# Patient Record
Sex: Female | Born: 1990 | Race: Black or African American | Hispanic: No | Marital: Single | Smoking: Never smoker
Health system: Southern US, Community
[De-identification: ages and names within clinical notes are randomized; demographics above are authoritative.]

---

## 2013-06-05 ENCOUNTER — Encounter (HOSPITAL_COMMUNITY): Payer: Self-pay | Admitting: Emergency Medicine

## 2013-06-05 ENCOUNTER — Emergency Department (HOSPITAL_COMMUNITY)
Admission: EM | Admit: 2013-06-05 | Discharge: 2013-06-05 | Disposition: A | Discharge status: Home / Self Care | Payer: BC Managed Care – PPO | Attending: Emergency Medicine | Admitting: Emergency Medicine

## 2013-06-05 ENCOUNTER — Emergency Department (HOSPITAL_COMMUNITY): Payer: BC Managed Care – PPO

## 2013-06-05 DIAGNOSIS — R0789 Other chest pain: Secondary | ICD-10-CM

## 2013-06-05 DIAGNOSIS — R071 Chest pain on breathing: Secondary | ICD-10-CM | POA: Insufficient documentation

## 2013-06-05 MED ORDER — IBUPROFEN 800 MG PO TABS: 800.0000 mg | ORAL_TABLET | Freq: Three times a day (TID) | ORAL | Status: AC

## 2013-06-05 MED ORDER — IBUPROFEN 800 MG PO TABS
800.0000 mg | ORAL_TABLET | Freq: Once | ORAL | Status: AC
  Administered 2013-06-05: 800 mg via ORAL
  Filled 2013-06-05: qty 1

## 2013-06-05 NOTE — Discharge Instructions (Signed)

## 2013-06-05 NOTE — ED Provider Notes (Signed)
CSN: [id number]     Arrival date & time 06/05/13  0131 History   First MD Initiated Contact with Patient 06/05/13 0354     No chief complaint on file.    (Consider location/radiation/quality/duration/timing/severity/associated sxs/prior Treatment) HPI  History provided by patient. Workup tonight around midnight with sharp left-sided chest pain. She is able to point with one finger where hurts. Severe pain lasts about an hour. Is worse with palpation or movement. No associated shortness of breath. No trauma. No radiation of pain. No history of same. Patient denies any cough or recent illness. She denies any leg pain or leg swelling. She is a nonsmoker and does not take birth control pills. No recent travel and no sick contacts.  History reviewed. No pertinent past medical history. History reviewed. No pertinent past surgical history. No family history on file. History  Substance Use Topics  . Smoking status: Never Smoker   . Smokeless tobacco: Not on file  . Alcohol Use: No   OB History   Grav Para Term Preterm Abortions TAB SAB Ect Mult Living                 Review of Systems  Constitutional: Negative for fever and chills.  HENT: Negative for sore throat.   Eyes: Negative for itching.  Respiratory: Negative for cough, chest tightness and shortness of breath.   Cardiovascular: Positive for chest pain.  Gastrointestinal: Negative for vomiting and abdominal pain.  Genitourinary: Negative for dysuria.  Musculoskeletal: Negative for back pain.  Skin: Negative for rash.  Neurological: Negative for headaches.  All other systems reviewed and are negative.     Allergies  Review of patient's allergies indicates no known allergies.  Home Medications   Prior to Admission medications   Not on File   BP 125/66  Pulse 68  Temp(Src) 98.1 F (36.7 C) (Oral)  Resp 18  SpO2 99%  LMP 05/29/2013 Physical Exam  Nursing note and vitals reviewed. Constitutional: She is oriented to  person, place, and time. She appears well-developed and well-nourished.  HENT:  Head: Normocephalic and atraumatic.  Eyes: EOM are normal. Pupils are equal, round, and reactive to light.  Neck: Neck supple.  Cardiovascular: Normal rate, normal heart sounds and intact distal pulses.   Pulmonary/Chest: Effort normal and breath sounds normal. No respiratory distress. She has no wheezes. She has no rales.  Finger tip area of reproducible tenderness left anterior lateral chest wall. No crepitus or erythema.  Abdominal: Soft. Bowel sounds are normal. She exhibits no distension. There is no tenderness.  Musculoskeletal: Normal range of motion.  No lower extremity swelling or calf tenderness. No cords.  Neurological: She is alert and oriented to person, place, and time.  Skin: Skin is warm and dry.    ED Course  Procedures (including critical care time) Labs Review Labs Reviewed - No data to display  Imaging Review Dg Chest 2 View  06/05/2013   CLINICAL DATA:  Left-sided chest pain tonight.  EXAM: CHEST  2 VIEW  COMPARISON:  None.  FINDINGS: The heart size and mediastinal contours are within normal limits. Both lungs are clear. The visualized skeletal structures are unremarkable.  IMPRESSION: No active cardiopulmonary disease.   Electronically Signed   By: Burman NievesWilliam  Stevens M.D.   On: 06/05/2013 04:16   Room air pulse ox 98% is adequate  Motrin provided.  PERC negative  Symptomatically improving in ER.  Plan discharge home with prescription for Motrin and outpatient followup as needed for any  persistent symptoms. Patient agrees to all discharge and followup instructions.  MDM   Findings: Chest wall pain  Presents with a few hours of sharp left-sided chest pain, reproducible tenderness. Chest x-ray reviewed as above no pneumothorax or acute abnormality. She is PERC negative and her presentation does not suggest pulmonary embolism. She is not hypoxic and has no respiratory distress. Vital  signs within normal limits. She is stable and appropriate for discharge home and outpatient management of symptoms.   Sunnie NielsenBrian Braelin Brosch, MD 06/05/13 [phone]

## 2013-06-05 NOTE — ED Notes (Addendum)
Pt. reports intermittent left anterior ribcage pain onset this evening worse when laughing , denies  injury , respirations unlabored .

## 2015-04-30 IMAGING — CR DG CHEST 2V
2 series · 2 of 2 positions shown · non-contrast
Comparison: None.

CLINICAL DATA: Left-sided chest pain tonight.

EXAM:
CHEST  2 VIEW

[w chest pa]
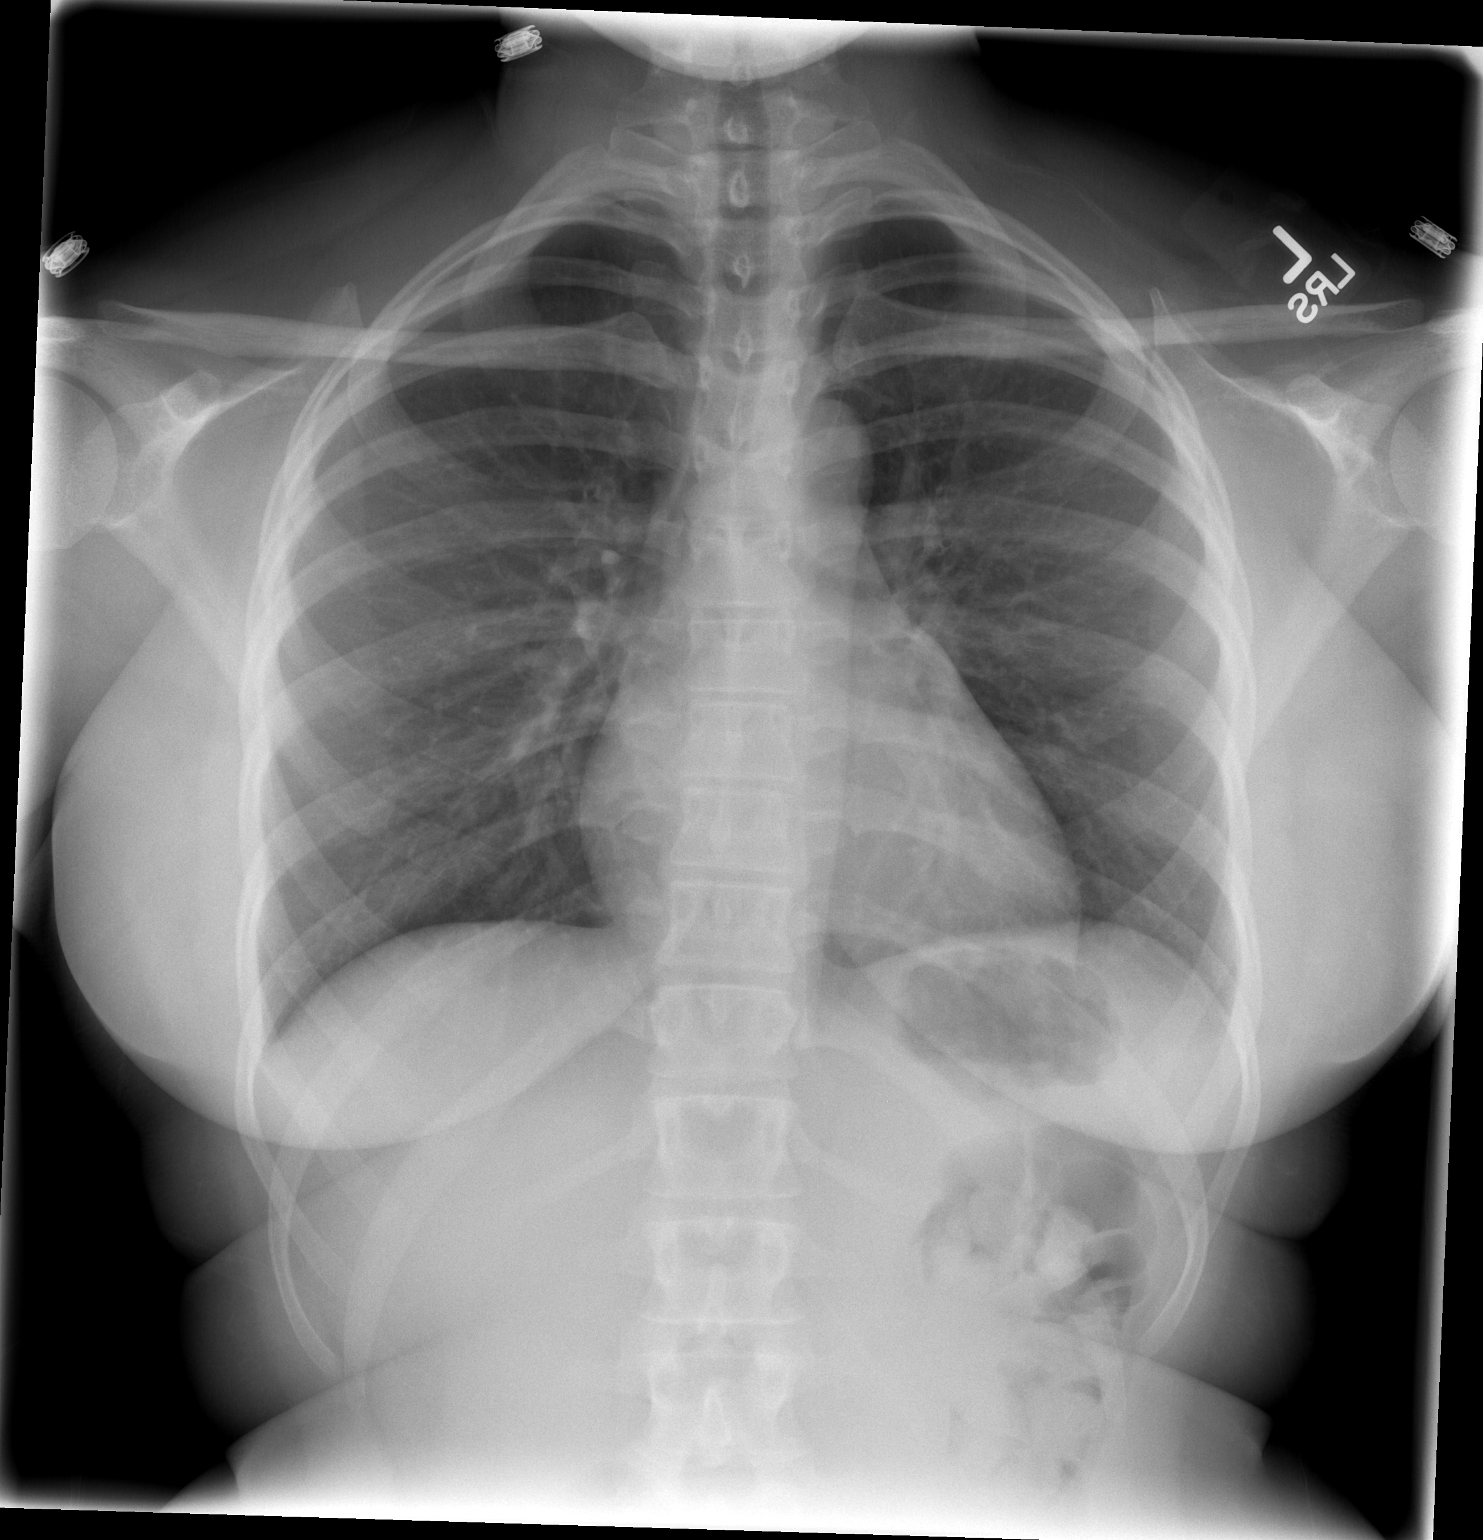

[w chest lat]
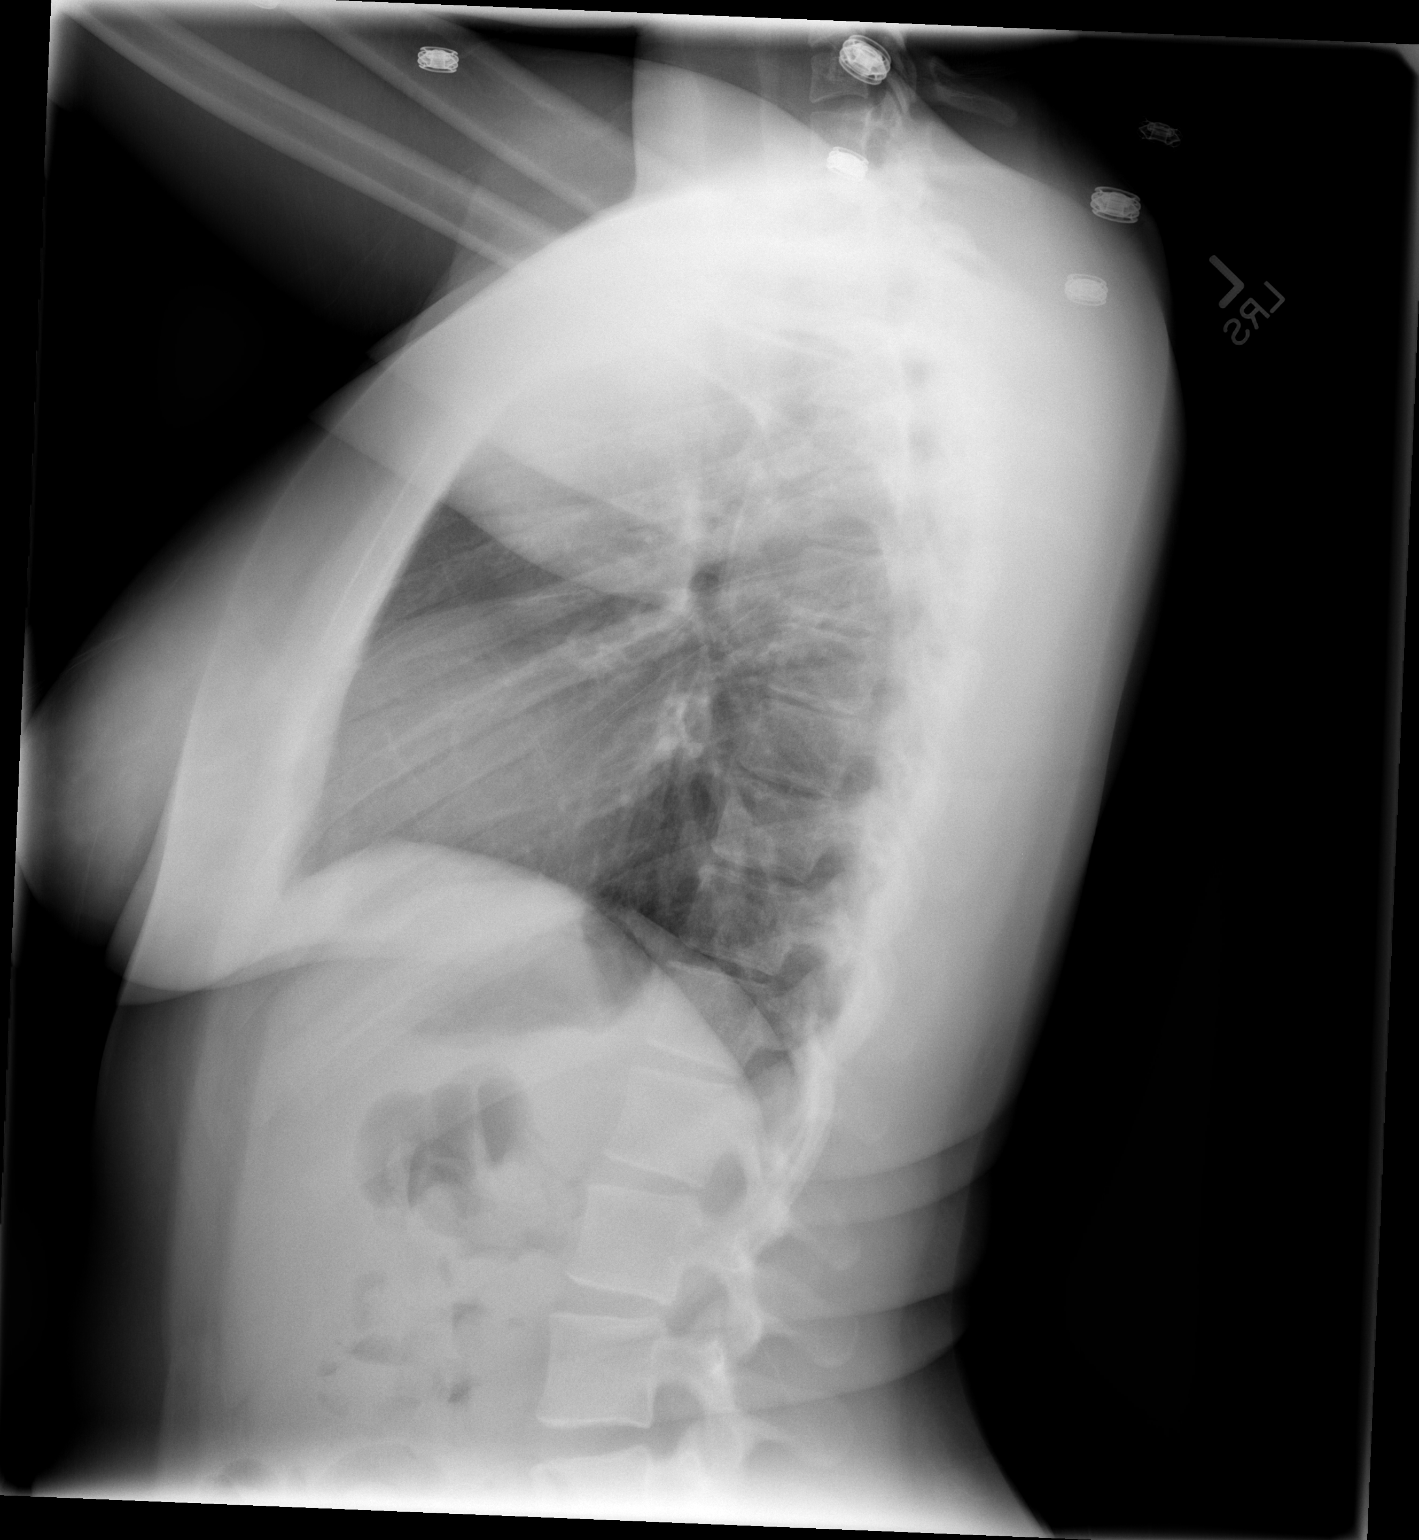

[2 of 2 positions shown; findings below may reference images not displayed]

FINDINGS: The heart size and mediastinal contours are within normal limits.
Both lungs are clear. The visualized skeletal structures are
unremarkable.
IMPRESSION: No active cardiopulmonary disease.
